# Patient Record
Sex: Female | Born: 1995 | Race: Black or African American | Hispanic: No | Marital: Single | Smoking: Never smoker
Health system: Southern US, Community
[De-identification: ages and names within clinical notes are randomized; demographics above are authoritative.]

---
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
## 2017-07-11 ENCOUNTER — Ambulatory Visit: Payer: Self-pay | Admitting: Podiatry

## 2018-05-23 ENCOUNTER — Emergency Department (HOSPITAL_BASED_OUTPATIENT_CLINIC_OR_DEPARTMENT_OTHER)
Admission: EM | Admit: 2018-05-23 | Discharge: 2018-05-23 | Disposition: A | Discharge status: Home / Self Care | Payer: Self-pay | Attending: Emergency Medicine | Admitting: Emergency Medicine

## 2018-05-23 ENCOUNTER — Other Ambulatory Visit: Payer: Self-pay

## 2018-05-23 ENCOUNTER — Encounter (HOSPITAL_BASED_OUTPATIENT_CLINIC_OR_DEPARTMENT_OTHER): Payer: Self-pay | Admitting: Adult Health

## 2018-05-23 ENCOUNTER — Emergency Department (HOSPITAL_BASED_OUTPATIENT_CLINIC_OR_DEPARTMENT_OTHER): Payer: Self-pay

## 2018-05-23 DIAGNOSIS — J111 Influenza due to unidentified influenza virus with other respiratory manifestations: Secondary | ICD-10-CM | POA: Insufficient documentation

## 2018-05-23 DIAGNOSIS — R69 Illness, unspecified: Secondary | ICD-10-CM

## 2018-05-23 LAB — CBC WITH DIFFERENTIAL/PLATELET
Abs Immature Granulocytes: 0.01 10*3/uL (ref 0.00–0.07)
BASOS ABS: 0 10*3/uL (ref 0.0–0.1)
Basophils Relative: 0 %
EOS ABS: 0.1 10*3/uL (ref 0.0–0.5)
Eosinophils Relative: 2 %
HEMATOCRIT: 38.8 % (ref 36.0–46.0)
Hemoglobin: 12.2 g/dL (ref 12.0–15.0)
Immature Granulocytes: 0 %
Lymphocytes Relative: 59 %
Lymphs Abs: 2.3 10*3/uL (ref 0.7–4.0)
MCH: 25.4 pg — ABNORMAL LOW (ref 26.0–34.0)
MCHC: 31.4 g/dL (ref 30.0–36.0)
MCV: 80.7 fL (ref 80.0–100.0)
Monocytes Absolute: 0.4 10*3/uL (ref 0.1–1.0)
Monocytes Relative: 10 %
NRBC: 0 % (ref 0.0–0.2)
Neutro Abs: 1.2 10*3/uL — ABNORMAL LOW (ref 1.7–7.7)
Neutrophils Relative %: 29 %
Platelets: 253 10*3/uL (ref 150–400)
RBC: 4.81 MIL/uL (ref 3.87–5.11)
RDW: 14.7 % (ref 11.5–15.5)
WBC: 4 10*3/uL (ref 4.0–10.5)

## 2018-05-23 LAB — COMPREHENSIVE METABOLIC PANEL
ALBUMIN: 3.9 g/dL (ref 3.5–5.0)
ALK PHOS: 48 U/L (ref 38–126)
ALT: 15 U/L (ref 0–44)
ANION GAP: 8 (ref 5–15)
AST: 22 U/L (ref 15–41)
BILIRUBIN TOTAL: 0.4 mg/dL (ref 0.3–1.2)
BUN: 13 mg/dL (ref 6–20)
CALCIUM: 8.4 mg/dL — AB (ref 8.9–10.3)
CO2: 22 mmol/L (ref 22–32)
Chloride: 105 mmol/L (ref 98–111)
Creatinine, Ser: 0.78 mg/dL (ref 0.44–1.00)
GFR calc Af Amer: >60 mL/min (ref >60)
GFR calc non Af Amer: >60 mL/min (ref >60)
GLUCOSE: 94 mg/dL (ref 70–99)
Potassium: 3.2 mmol/L — ABNORMAL LOW (ref 3.5–5.1)
Sodium: 135 mmol/L (ref 135–145)
TOTAL PROTEIN: 7.7 g/dL (ref 6.5–8.1)

## 2018-05-23 MED ORDER — FLUTICASONE PROPIONATE 50 MCG/ACT NA SUSP: 1.0000 | Freq: Every day | NASAL | 2 refills | Status: AC

## 2018-05-23 MED ORDER — ONDANSETRON 4 MG PO TBDP: 4.0000 mg | ORAL_TABLET | Freq: Three times a day (TID) | ORAL | 0 refills | Status: AC | PRN

## 2018-05-23 NOTE — ED Triage Notes (Signed)
Presents with flu like illness, chills, headache, cough and congestion for 2 days.

## 2018-05-23 NOTE — Discharge Instructions (Signed)
You were evaluated today for flulike illness.  You are outside the treatment window for Tamiflu.  I have given you Zofran for nausea.  Please take as prescribed.  You may also take over-the-counter Imodium for your diarrhea.  Please take Tylenol and ibuprofen as prescribed for your body aches and pains.  I have also given you a prescription for Flonase for her nasal congestion.  You have symptoms beyond 1 week please see your PCP for reevaluation.  Return to the ED for any worsening symptoms.  If you develop fever please stay out of work or school until you are 24 hours without a fever without using Tylenol or ibuprofen.

## 2018-05-23 NOTE — ED Notes (Signed)
Pt is unable to urinate at this time. 

## 2018-05-23 NOTE — ED Provider Notes (Signed)
[facility] EMERGENCY DEPARTMENT Provider Note   CSN: [id number] Arrival date & time: 05/23/18  1044  History   Chief Complaint Chief Complaint  Patient presents with  . Cough    HPI Glenda Zimmerman is a 23 y.o. female with no significant past medical history who presents for evaluation of flulike symptoms.  Patient states she has had productive cough, congestion, rhinorrhea, chills, body aches and pains as well as headache which began days ago.  Patient states symptoms have worsened over the last 2 days.  Patient states she has had multiple episodes of nonbloody, nonbilious emesis.  Has also had 4 episodes of nonbloody diarrhea.  Patient states she has been able to tolerate p.o. liquids, however has had a decreased appetite for solid foods.  Has not take anything for symptoms.  Denies receiving influenza vaccine.  Has had multiple sick contacts.  Patient states she has felt warm, however has not taken her temperature.  Denies vision changes, eye pain, sudden onset thunderclap headache, neck pain, neck stiffness, sore throat, chest pain, shortness of breath, abdominal pain, pelvic pain, dysuria, constipation, vaginal discharge or irregular vaginal bleeding.  Patient states she is currently on her menstrual cycle.  Patient describes her headache as a fullness sensation over her frontal sinuses.  History obtained from patient.  No interpreter was used.  HPI  History reviewed. No pertinent past medical history.  There are no active problems to display for this patient.   History reviewed. No pertinent surgical history.   OB History   No obstetric history on file.      Home Medications    Prior to Admission medications   Medication Sig Start Date End Date Taking? Authorizing Provider  fluticasone (FLONASE) 50 MCG/ACT nasal spray Place 1 spray into both nostrils daily. 05/23/18   Henderly, Britni A, PA-C  ondansetron (ZOFRAN ODT) 4 MG disintegrating tablet Take 1 tablet (4 mg  total) by mouth every 8 (eight) hours as needed for nausea or vomiting. 05/23/18   Henderly, Britni A, PA-C    Family History History reviewed. No pertinent family history.  Social History Social History   Tobacco Use  . Smoking status: Never Smoker  Substance Use Topics  . Alcohol use: No  . Drug use: No     Allergies   Patient has no known allergies.   Review of Systems Review of Systems  Constitutional: Positive for appetite change, chills, fatigue and fever.  HENT: Positive for congestion, postnasal drip, rhinorrhea and sinus pressure. Negative for dental problem, ear discharge, ear pain, facial swelling, hearing loss, nosebleeds, sinus pain, sneezing, sore throat, tinnitus, trouble swallowing and voice change.   Eyes: Negative.   Respiratory: Positive for cough. Negative for choking, chest tightness, shortness of breath, wheezing and stridor.   Cardiovascular: Negative.   Gastrointestinal: Positive for diarrhea and vomiting. Negative for abdominal distention, abdominal pain, anal bleeding, blood in stool, constipation, nausea and rectal pain.  Genitourinary: Negative.   Musculoskeletal: Negative.   Skin: Negative.   Neurological: Positive for headaches. Negative for dizziness, tremors, facial asymmetry, weakness and light-headedness.  All other systems reviewed and are negative.    Physical Exam Updated Vital Signs BP 115/76 (BP Location: Right Arm)   Pulse 75   Temp 98.3 F (36.8 C) (Oral)   Resp 14   LMP 05/20/2018 (Approximate)   SpO2 100%   Physical Exam Vitals signs and nursing note reviewed.  Constitutional:      General: She is not in acute distress.  Appearance: She is well-developed. She is not ill-appearing, toxic-appearing or diaphoretic.  HENT:     Head: Normocephalic and atraumatic.     Nose: Congestion and rhinorrhea present.     Comments: Clear rhinorrhea to bilateral nares.    Mouth/Throat:     Comments: Mucous membranes moist.  No oral  lesions.  Posterior oropharynx without erythema or exudate.  Tonsils without edema or exudate.  Uvula midline without deviation.  No evidence of PTA or RPA. Eyes:     Pupils: Pupils are equal, round, and reactive to light.     Comments: No horizontal, vertical or rotational nystagmus   Neck:     Musculoskeletal: Normal range of motion.     Comments: Full active and passive ROM without pain No midline or paraspinal tenderness No nuchal rigidity or meningeal signs  Cardiovascular:     Rate and Rhythm: Normal rate.     Pulses: Normal pulses.     Heart sounds: Normal heart sounds.  Pulmonary:     Effort: No respiratory distress.     Comments: Clear to auscultation bilaterally without wheeze, rhonchi or rales.  No accessory muscle usage.  Able to speak in full sentences without difficulty. Abdominal:     General: There is no distension.     Comments: Soft, nontender without rebound or guarding.  Normoactive bowel sounds.  No CVA tenderness.  Musculoskeletal: Normal range of motion.     Comments: Moves all extremities without difficulty.  Ambulatory in department without difficulty.  Skin:    General: Skin is warm and dry.     Comments: No rashes or lesions  Neurological:     Mental Status: She is alert.     Comments: Mental Status:  Alert, oriented, thought content appropriate. Speech fluent without evidence of aphasia. Able to follow 2 step commands without difficulty.  Cranial Nerves:  II:  Peripheral visual fields grossly normal, pupils equal, round, reactive to light III,IV, VI: ptosis not present, extra-ocular motions intact bilaterally  V,VII: smile symmetric, facial light touch sensation equal VIII: hearing grossly normal bilaterally  IX,X: midline uvula rise  XI: bilateral shoulder shrug equal and strong XII: midline tongue extension  Motor:  5/5 in upper and lower extremities bilaterally including strong and equal grip strength and dorsiflexion/plantar flexion Sensory:  Pinprick and light touch normal in all extremities.  Deep Tendon Reflexes: 2+ and symmetric  Cerebellar: normal finger-to-nose with bilateral upper extremities Gait: normal gait and balance CV: distal pulses palpable throughout       ED Treatments / Results  Labs (all labs ordered are listed, but only abnormal results are displayed) Labs Reviewed  CBC WITH DIFFERENTIAL/PLATELET - Abnormal; Notable for the following components:      Result Value   MCH 25.4 (*)    Neutro Abs 1.2 (*)    All other components within normal limits  COMPREHENSIVE METABOLIC PANEL - Abnormal; Notable for the following components:   Potassium 3.2 (*)    Calcium 8.4 (*)    All other components within normal limits  URINALYSIS, ROUTINE W REFLEX MICROSCOPIC  PREGNANCY, URINE    EKG None  Radiology Dg Chest 2 View  Result Date: 05/23/2018 CLINICAL DATA:  Cough and fever for 3 days. EXAM: CHEST - 2 VIEW COMPARISON:  None. FINDINGS: The heart size and mediastinal contours are within normal limits. Both lungs are clear. The visualized skeletal structures are unremarkable. IMPRESSION: Normal.  No active cardiopulmonary disease. Electronically Signed   By: Alver Sorrow.D.  On: 05/23/2018 11:27    Procedures Procedures (including critical care time)  Medications Ordered in ED Medications - No data to display   Initial Impression / Assessment and Plan / ED Course  I have reviewed the triage vital signs and the nursing notes.  Pertinent labs & imaging results that were available during my care of the patient were reviewed by me and considered in my medical decision making (see chart for details).  23 year old female who appears otherwise well presents for evaluation of flulike symptoms.  Afebrile, nonseptic, non-ill-appearing.  Lungs clear to auscultation bilaterally without wheeze, rhonchi or rales, chest x-ray negative for pneumonia.  No hypoxia, tachypnea or tachycardia.  Has had a headache over her  frontal sinuses.  Denies sudden onset thunderclap headache.  Normal neurologic exam without neurologic deficits. Describes as a fullness. Likely a sinus headche. Has been able to tolerate p.o. intake in department without difficulty.  CBC without leukocytosis, metabolic panel with mild hypokalemia, does not want oral supplementation at this time.  Abdomen soft, nontender without rebound or guarding.  No peritoneal signs.  Patient does not have a surgical abdomen.  Patient is unable to urinate currently.  On reevaluation patient requesting DC home at this time.  Still unable to provide urine sample at this time.  Patient requesting DC home despite urinalysis unable to be obtained.  Patient is nontoxic, nonseptic appearing, in no apparent distress. Patient does not meet the SIRS or Sepsis criteria.  On repeat exam patient does not have a surgical abdomin and there are no peritoneal signs.  No indication of appendicitis, bowel obstruction, bowel perforation, cholecystitis, diverticulitis.  Symptoms most likely from flulike illness.  Patient understand that she is outside treatment window for Tamiflu.  I have discussed symptomatic treatment and given strict instructions for follow-up with their primary care physician.  I have also discussed reasons to return immediately to the ER.  Patient expresses understanding and agrees with plan.   Final Clinical Impressions(s) / ED Diagnoses   Final diagnoses:  Influenza-like illness    ED Discharge Orders         Ordered    ondansetron (ZOFRAN ODT) 4 MG disintegrating tablet  Every 8 hours PRN     05/23/18 1236    fluticasone (FLONASE) 50 MCG/ACT nasal spray  Daily     05/23/18 1236           Henderly, Britni A, PA-C 05/23/18 1330    Rolan BuccoBelfi, Melanie, MD 05/23/18 1344

## 2019-03-09 ENCOUNTER — Other Ambulatory Visit: Payer: Self-pay

## 2019-03-09 DIAGNOSIS — Z20822 Contact with and (suspected) exposure to covid-19: Secondary | ICD-10-CM

## 2019-03-10 LAB — NOVEL CORONAVIRUS, NAA: SARS-CoV-2, NAA: NOT DETECTED

## 2020-07-02 IMAGING — DX DG CHEST 2V
2 series · 2 of 2 positions shown · non-contrast
Comparison: None.

CLINICAL DATA: Cough and fever for 3 days.

EXAM:
CHEST - 2 VIEW

[chest pa]
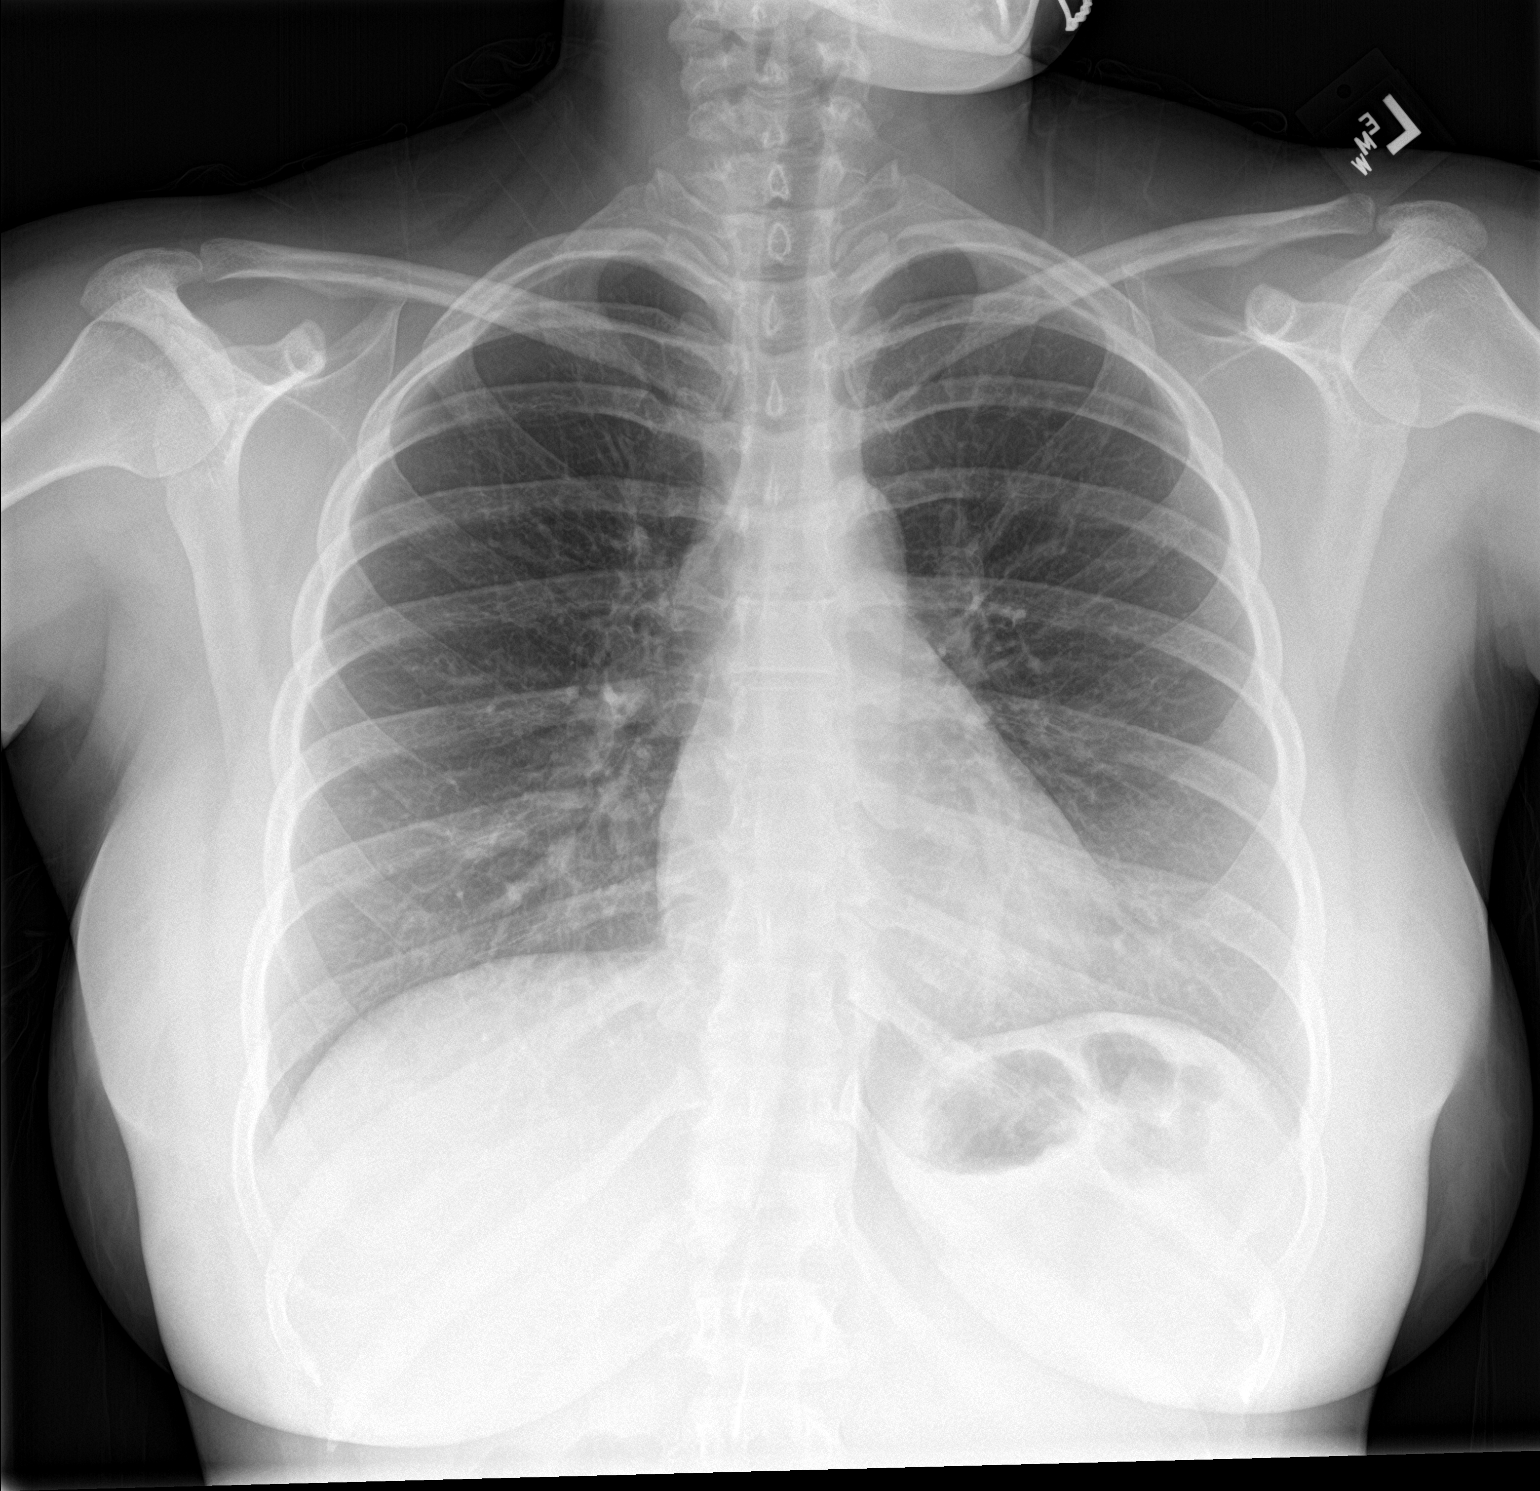

[chest lat]
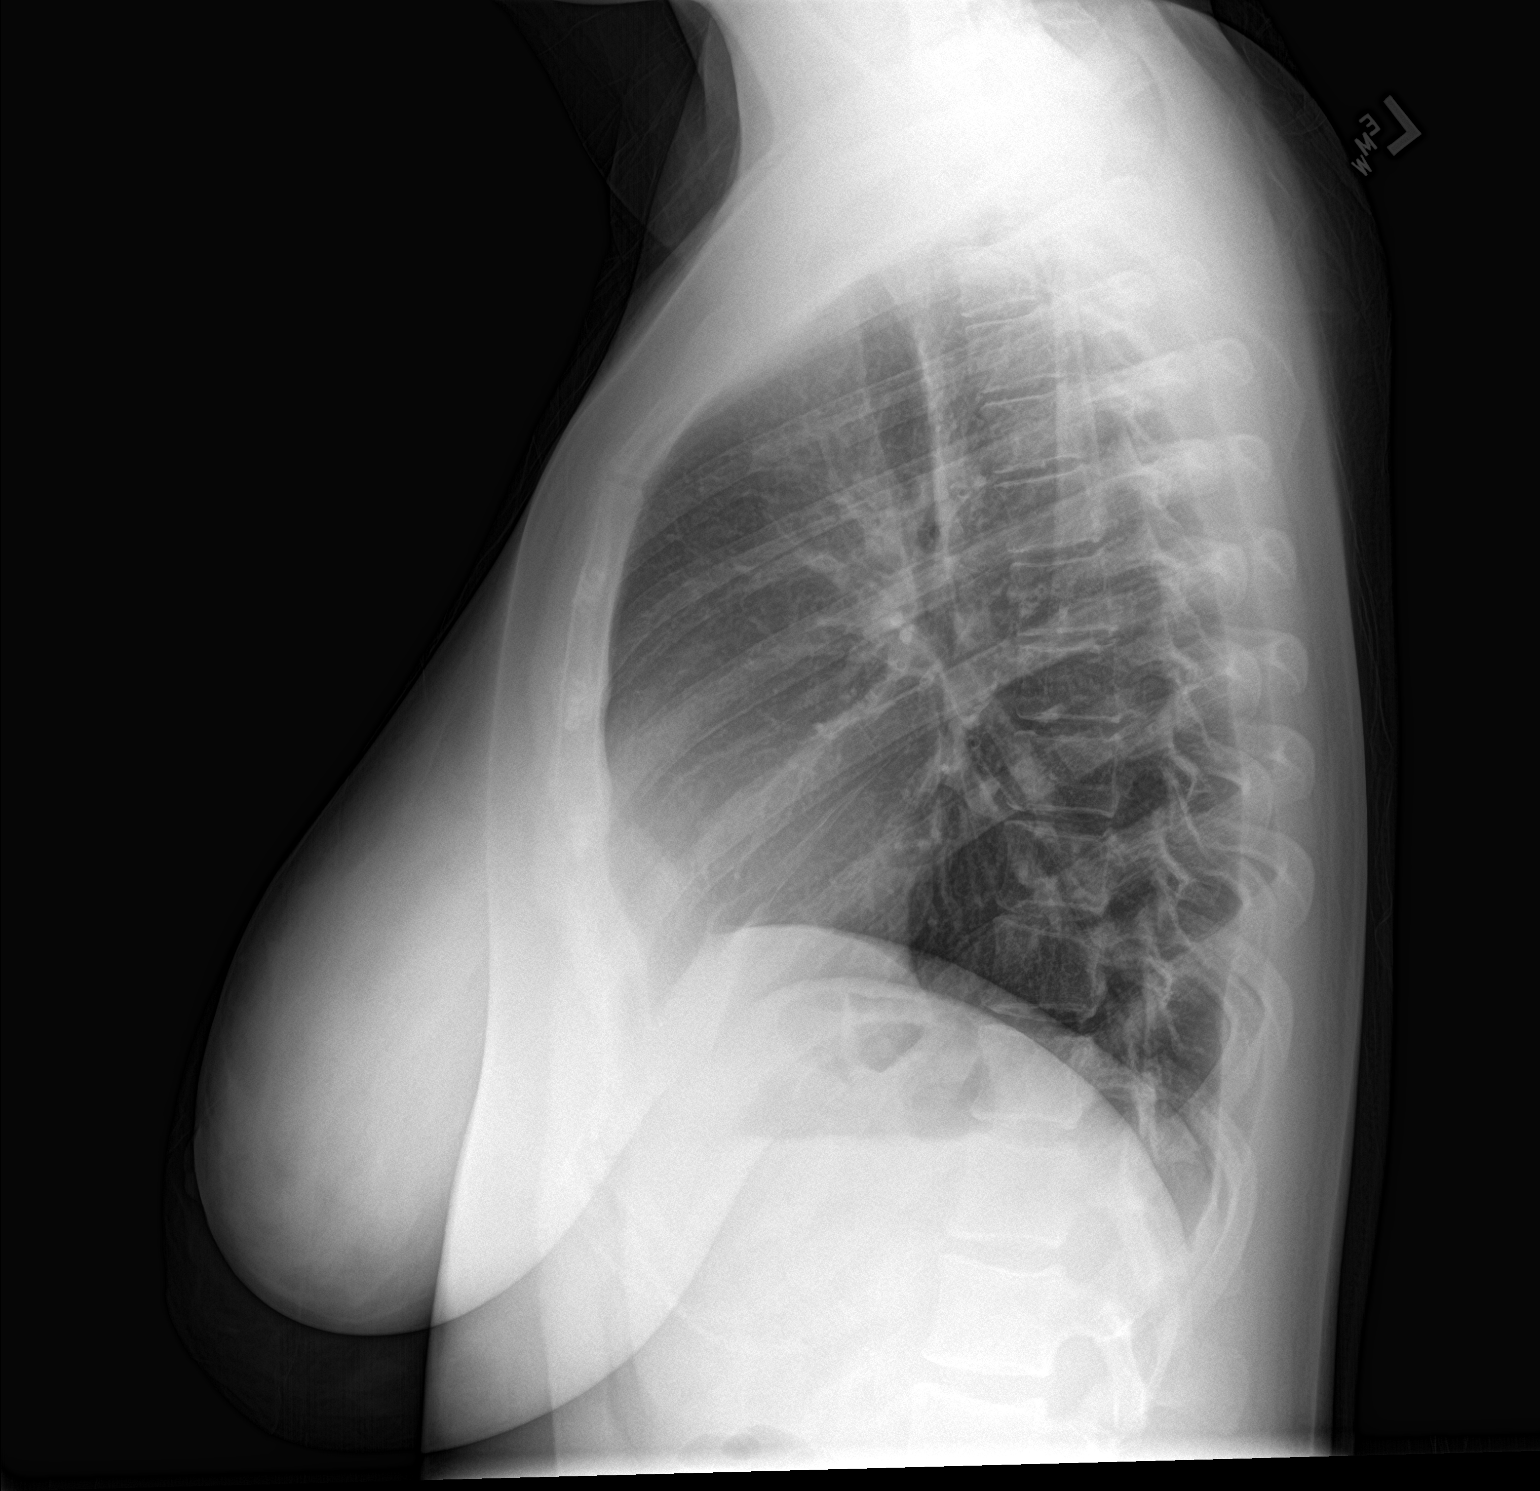

[2 of 2 positions shown; findings below may reference images not displayed]

FINDINGS: The heart size and mediastinal contours are within normal limits.
Both lungs are clear. The visualized skeletal structures are
unremarkable.
IMPRESSION: Normal.  No active cardiopulmonary disease.
